# Patient Record
Sex: Male | Born: 1990 | Race: White | Hispanic: No | Marital: Single | Smoking: Former smoker
Health system: Southern US, Community
[De-identification: ages and names within clinical notes are randomized; demographics above are authoritative.]

## PROBLEM LIST (undated history)

## (undated) HISTORY — PX: TONSILLECTOMY: SUR1361

## (undated) HISTORY — PX: TENDON REPAIR: SHX5111

---
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
## 2012-06-20 ENCOUNTER — Emergency Department: Payer: Self-pay | Admitting: Emergency Medicine

## 2012-06-20 LAB — BASIC METABOLIC PANEL
Anion Gap: 8 (ref 7–16)
BUN: 12 mg/dL (ref 7–18)
Chloride: 104 mmol/L (ref 98–107)
Co2: 26 mmol/L (ref 21–32)
Creatinine: 0.91 mg/dL (ref 0.60–1.30)
Glucose: 83 mg/dL (ref 65–99)
Osmolality: 275 (ref 275–301)

## 2012-06-20 LAB — MONONUCLEOSIS SCREEN: Mono Test: POSITIVE

## 2012-06-20 LAB — CBC
HCT: 43.4 % (ref 40.0–52.0)
HGB: 15 g/dL (ref 13.0–18.0)
MCH: 31.9 pg (ref 26.0–34.0)
MCHC: 34.5 g/dL (ref 32.0–36.0)
MCV: 92 fL (ref 80–100)
Platelet: 198 10*3/uL (ref 150–440)
RDW: 12.5 % (ref 11.5–14.5)
WBC: 8.7 10*3/uL (ref 3.8–10.6)

## 2013-12-16 ENCOUNTER — Emergency Department: Payer: Self-pay | Admitting: Emergency Medicine

## 2015-09-15 IMAGING — CR RIGHT THUMB 2+V
1 series · 3 of 3 positions shown · non-contrast
Comparison: None.

CLINICAL DATA: Laceration to base of right thumb

EXAM:
RIGHT THUMB 2+V

[Series 1: pa · 0.17mm/px · 3 of 3 slices shown]
[im 1/3]
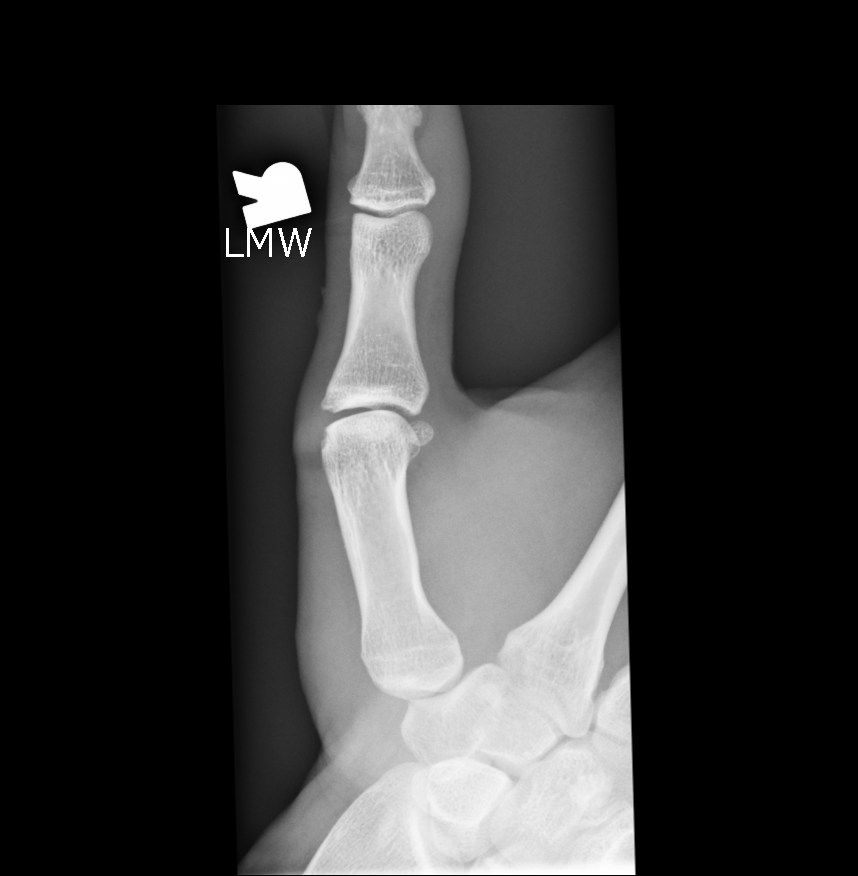
[im 2/3]
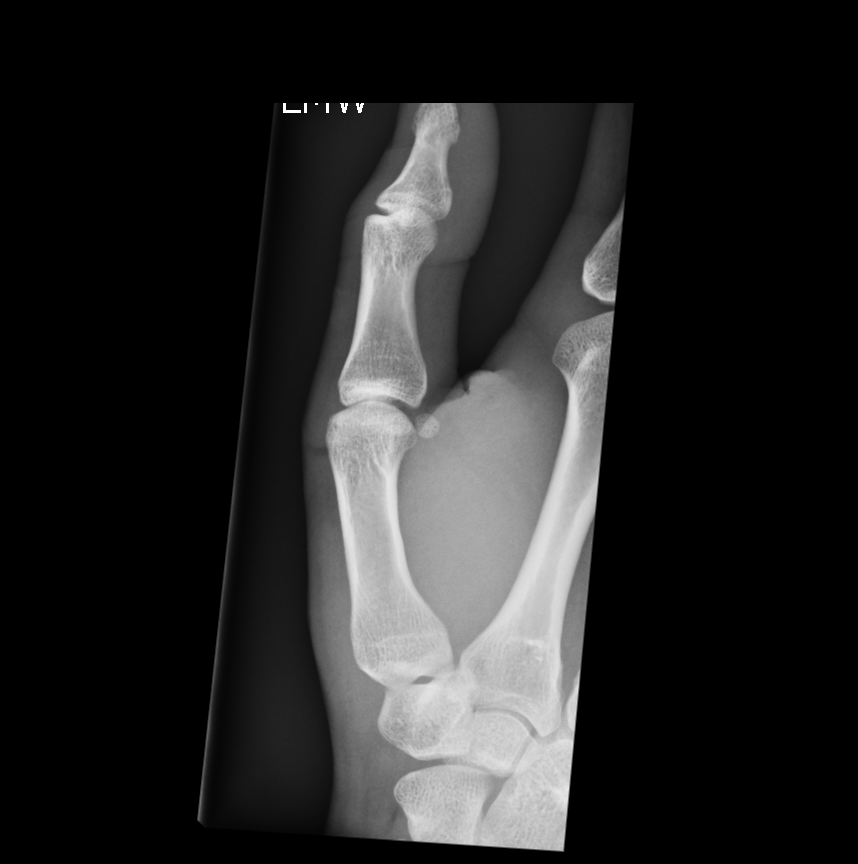
[im 3/3]
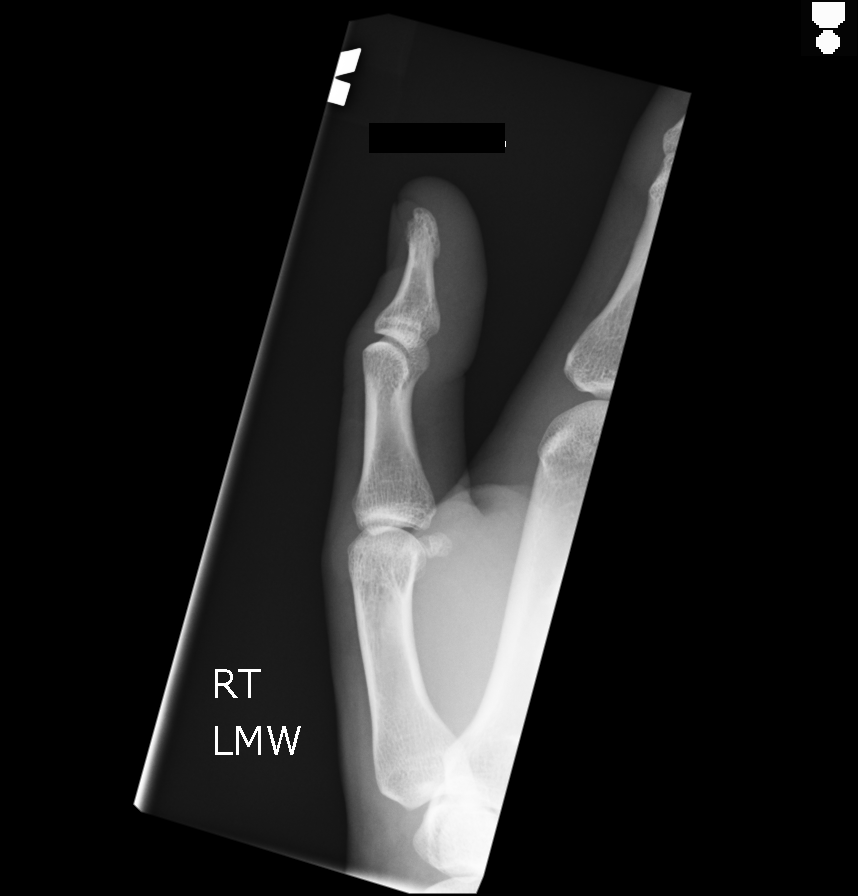

[3 of 3 positions shown; findings below may reference images not displayed]

FINDINGS: There is no evidence of fracture or dislocation. There is no
evidence of arthropathy or other focal bone abnormality. Soft tissue
laceration adjacent to the distal aspect of the first metacarpal
bone is identified. No foreign body noted.
IMPRESSION: Base of thumb laceration.

## 2015-11-15 ENCOUNTER — Ambulatory Visit
Admission: EM | Admit: 2015-11-15 | Discharge: 2015-11-15 | Disposition: A | Discharge status: Home / Self Care | Payer: BLUE CROSS/BLUE SHIELD | Attending: Family Medicine | Admitting: Family Medicine

## 2015-11-15 DIAGNOSIS — J029 Acute pharyngitis, unspecified: Secondary | ICD-10-CM | POA: Diagnosis not present

## 2015-11-15 LAB — RAPID STREP SCREEN (MED CTR MEBANE ONLY): STREPTOCOCCUS, GROUP A SCREEN (DIRECT): NEGATIVE

## 2015-11-15 MED ORDER — FEXOFENADINE-PSEUDOEPHED ER 180-240 MG PO TB24: 1.0000 | ORAL_TABLET | Freq: Every day | ORAL | 0 refills | Status: DC

## 2015-11-15 MED ORDER — BENZONATATE 200 MG PO CAPS: 200.0000 mg | ORAL_CAPSULE | Freq: Three times a day (TID) | ORAL | 0 refills | Status: DC | PRN

## 2015-11-15 NOTE — ED Triage Notes (Signed)
Pt with hx of frequent strep. Tonsillectomy, but now with sore throat that feels like strep to him again. Subjective fevers. Pain 4/10.

## 2015-11-15 NOTE — ED Provider Notes (Signed)
[facility]    CSN: [id number] Arrival date & time: 11/15/15  1238     History   Chief Complaint Chief Complaint  Patient presents with  . Sore Throat    HPI Walter Graves is a 25 y.o. male.   Patient's here because of sore throat. Sore throat started Tuesday actually felt a little bit better on Wednesday but got worse this morning. He states he feels his glasses throat. He is to get strep a lot before he has tonsillectomy. He also has had a tendon repair as well. Because of his history of recurrent strep he was worried and came in to be seen and evaluated. Past medical history is negative. He does not smoke. No known drug allergies. No pertinent family medical history significant to this visit.   The history is provided by the patient. No language interpreter was used.  Sore Throat  This is a new problem. The current episode started 2 days ago. The problem occurs constantly. The problem has been gradually worsening. Pertinent negatives include no chest pain, no abdominal pain and no headaches. Nothing aggravates the symptoms. Nothing relieves the symptoms. He has tried nothing for the symptoms. The treatment provided no relief.    History reviewed. No pertinent past medical history.  There are no active problems to display for this patient.   Past Surgical History:  Procedure Laterality Date  . TENDON REPAIR    . TONSILLECTOMY         Home Medications    Prior to Admission medications   Not on File    Family History History reviewed. No pertinent family history.  Social History Social History  Substance Use Topics  . Smoking status: Never Smoker  . Smokeless tobacco: Never Used  . Alcohol use Yes     Comment: social     Allergies   Review of patient's allergies indicates no known allergies.   Review of Systems Review of Systems  HENT: Positive for postnasal drip, rhinorrhea and sore throat. Negative for sinus pressure.   Respiratory:  Positive for cough.   Cardiovascular: Negative for chest pain.  Gastrointestinal: Negative for abdominal pain.  Neurological: Negative for headaches.  All other systems reviewed and are negative.    Physical Exam Triage Vital Signs ED Triage Vitals  Enc Vitals Group     BP 11/15/15 1313 116/71     Pulse Rate 11/15/15 1313 67     Resp 11/15/15 1313 16     Temp 11/15/15 1313 98 F (36.7 C)     Temp src --      SpO2 11/15/15 1313 100 %     Weight 11/15/15 1315 165 lb (74.8 kg)     Height 11/15/15 1315 5\' 10"  (1.778 m)     Head Circumference --      Peak Flow --      Pain Score 11/15/15 1316 4     Pain Loc --      Pain Edu? --      Excl. in GC? --    No data found.   Updated Vital Signs BP 116/71 (BP Location: Left Arm)   Pulse 67   Temp 98 F (36.7 C)   Resp 16   Ht 5\' 10"  (1.778 m)   Wt 165 lb (74.8 kg)   SpO2 100%   BMI 23.68 kg/m   Visual Acuity Right Eye Distance:   Left Eye Distance:   Bilateral Distance:    Right Eye Near:  Left Eye Near:    Bilateral Near:     Physical Exam  Constitutional: He appears well-developed and well-nourished.  HENT:  Head: Normocephalic and atraumatic.  Eyes: Pupils are equal, round, and reactive to light.  Neck: Normal range of motion. Neck supple.  Cardiovascular: Normal rate and regular rhythm.   Pulmonary/Chest: Effort normal.  Musculoskeletal: Normal range of motion.  Lymphadenopathy:    He has cervical adenopathy.  Neurological: He is alert.  Skin: Skin is warm.  Psychiatric: He has a normal mood and affect.  Vitals reviewed.    UC Treatments / Results  Labs (all labs ordered are listed, but only abnormal results are displayed) Labs Reviewed  RAPID ST[facility]EN [facility])  CULTURE, GROUP A STREP [facility])    EKG  EKG Interpretation None       Radiology No results found.  Procedures Procedures (including critical care time)  Medications Ordered in UC Medications - No data to  display   Initial Impression / Assessment and Plan / UC Course  I have reviewed the triage vital signs and the nursing notes.  Pertinent labs & imaging results that were available during my care of the patient were reviewed by me and considered in my medical decision making (see chart for details).   Results for orders placed or performed during the hospital encounter of 11/15/15  Rapid strep screen  Result Value Ref Range   Streptococcus, Group A Screen (Direct) NEGATIVE NEGATIVE   Clinical Course  Patient was informed that his strep test was negative. We discussed this is probably viral pharyngitis. Will give him a work note for today and tomorrow Allegra-D one tablet daily Tessalon Perles 200 mg 13 times a day when necessary for cough return as needed.    Final diagnoses:  None    New Prescriptions New Prescriptions   No medications on file     Hassan RowanEugene Kathyleen Radice, MD 11/15/15 1457

## 2015-11-17 LAB — CULTURE, GROUP A STREP (THRC)

## 2015-11-21 ENCOUNTER — Telehealth: Payer: Self-pay | Admitting: *Deleted

## 2015-11-21 NOTE — Telephone Encounter (Signed)
Patient returned phone message. DOB verified, reported negative strep culture result. Patient reported feeling better.

## 2016-05-23 ENCOUNTER — Encounter: Payer: Self-pay | Admitting: Emergency Medicine

## 2016-05-23 ENCOUNTER — Ambulatory Visit
Admission: EM | Admit: 2016-05-23 | Discharge: 2016-05-23 | Disposition: A | Discharge status: Home / Self Care | Payer: BLUE CROSS/BLUE SHIELD | Attending: Family Medicine | Admitting: Family Medicine

## 2016-05-23 DIAGNOSIS — L247 Irritant contact dermatitis due to plants, except food: Secondary | ICD-10-CM

## 2016-05-23 MED ORDER — TRIAMCINOLONE ACETONIDE 0.1 % EX CREA: 1.0000 "application " | TOPICAL_CREAM | Freq: Two times a day (BID) | CUTANEOUS | 0 refills | Status: AC

## 2016-05-23 NOTE — ED Triage Notes (Signed)
Patient c/o itchy red bumps on his right upper arm since Tuesday.  Patient thinks that it could be scabies.

## 2016-05-23 NOTE — ED Provider Notes (Signed)
CSN: [id number]     Arrival date & time 05/23/16  0905 History   First MD Initiated Contact with Patient 05/23/16 [phone]     Chief Complaint  Patient presents with  . Rash   (Consider location/radiation/quality/duration/timing/severity/associated sxs/prior Treatment) HPI   This a 26 year old male who presents with red bumps on his right upper arm on Tuesday. They are itchy confined to a very small area of his upper inner arm. 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 Had no change in deodorants or soaps..       History reviewed. No pertinent past medical history. Past Surgical History:  Procedure Laterality Date  . TENDON REPAIR    . TONSILLECTOMY     History reviewed. No pertinent family history. Social History  Substance Use Topics  . Smoking status: Former Games developer  . Smokeless tobacco: Never Used  . Alcohol use Yes     Comment: social    Review of Systems  Constitutional: Negative for activity change, chills, fatigue and fever.  Skin: Positive for rash.  All other systems reviewed and are negative.   Allergies  Patient has no known allergies.  Home Medications   Prior to Admission medications   Medication Sig Start Date End Date Taking? Authorizing Provider  triamcinolone cream (KENALOG) 0.1 % Apply 1 application topically 2 (two) times daily. 05/23/16   Lutricia Feil, PA-C   Meds Ordered and Administered this Visit  Medications - No data to display  BP 139/74 (BP Location: Left Arm)   Pulse 66   Temp 98.5 F (36.9 C) (Oral)   Resp 16   Ht  (1.778 m)   Wt 165 lb (74.8 kg)   SpO2 100%   BMI 23.68 kg/m  No data found.   Physical Exam  Constitutional: He is oriented to person, place, and time. He appears well-developed and well-nourished. No distress.  HENT:  Head: Normocephalic and atraumatic.  Eyes: Pupils are equal, round, and reactive to light.  Neck: Normal range  of motion.  Musculoskeletal: Normal range of motion.  Neurological: He is alert and oriented to person, place, and time.  Skin: Rash noted. He is not diaphoretic.  Nursing note and vitals reviewed.       Urgent Care Course     Procedures (including critical care time)  Labs Review Labs Reviewed - No data to display  Imaging Review No results found.   Visual Acuity Review  Right Eye Distance:   Left Eye Distance:   Bilateral Distance:    Right Eye Near:   Left Eye Near:    Bilateral Near:         MDM   1. Irritant contact dermatitis due to plants, except food    Discharge Medication List as of 05/23/2016  9:54 AM    START taking these medications   Details  triamcinolone cream (KENALOG) 0.1 % Apply 1 application topically 2 (two) times daily., Starting Fri 05/23/2016, Normal      Plan: 1. Test/x-ray results and diagnosis reviewed with patient 2. rx as per orders; risks, benefits, potential side effects reviewed with patient 3. Recommend supportive treatment with Washing twice a day and applying triamcinolone cream. If it is not improving or starts putting he should follow-up with a dermatologist. 4. F/u prn if symptoms worsen or don't improve     Lutricia Feil, PA-C 05/23/16 1015
